# Patient Record
Sex: Male | Born: 1975 | Race: White | Hispanic: No | Marital: Married | Smoking: Current every day smoker
Health system: Southern US, Community
[De-identification: ages and names within clinical notes are randomized; demographics above are authoritative.]

## PROBLEM LIST (undated history)

## (undated) HISTORY — PX: OTHER SURGICAL HISTORY: SHX169

## (undated) HISTORY — PX: HERNIA REPAIR: SHX51

---

## 2003-09-02 ENCOUNTER — Encounter: Admission: RE | Admit: 2003-09-02 | Discharge: 2003-09-02 | Payer: Self-pay | Admitting: Specialist

## 2003-09-17 ENCOUNTER — Encounter: Admission: RE | Admit: 2003-09-17 | Discharge: 2003-09-17 | Payer: Self-pay | Admitting: Specialist

## 2003-10-02 ENCOUNTER — Encounter: Admission: RE | Admit: 2003-10-02 | Discharge: 2003-10-02 | Payer: Self-pay | Admitting: Specialist

## 2008-04-07 ENCOUNTER — Encounter: Admission: RE | Admit: 2008-04-07 | Discharge: 2008-04-07 | Payer: Self-pay | Admitting: Family Medicine

## 2009-03-23 ENCOUNTER — Emergency Department (HOSPITAL_COMMUNITY): Admission: EM | Admit: 2009-03-23 | Discharge: 2009-03-23 | Payer: Self-pay | Admitting: Family Medicine

## 2009-08-26 IMAGING — CR DG ANKLE COMPLETE 3+V*L*
3 series · 3 of 3 positions shown · non-contrast
Comparison: None

CLINICAL DATA: Injured several months ago with pain and swelling,
no recent trauma

LEFT ANKLE COMPLETE - 3+ VIEW

[t ankle joint ap left]
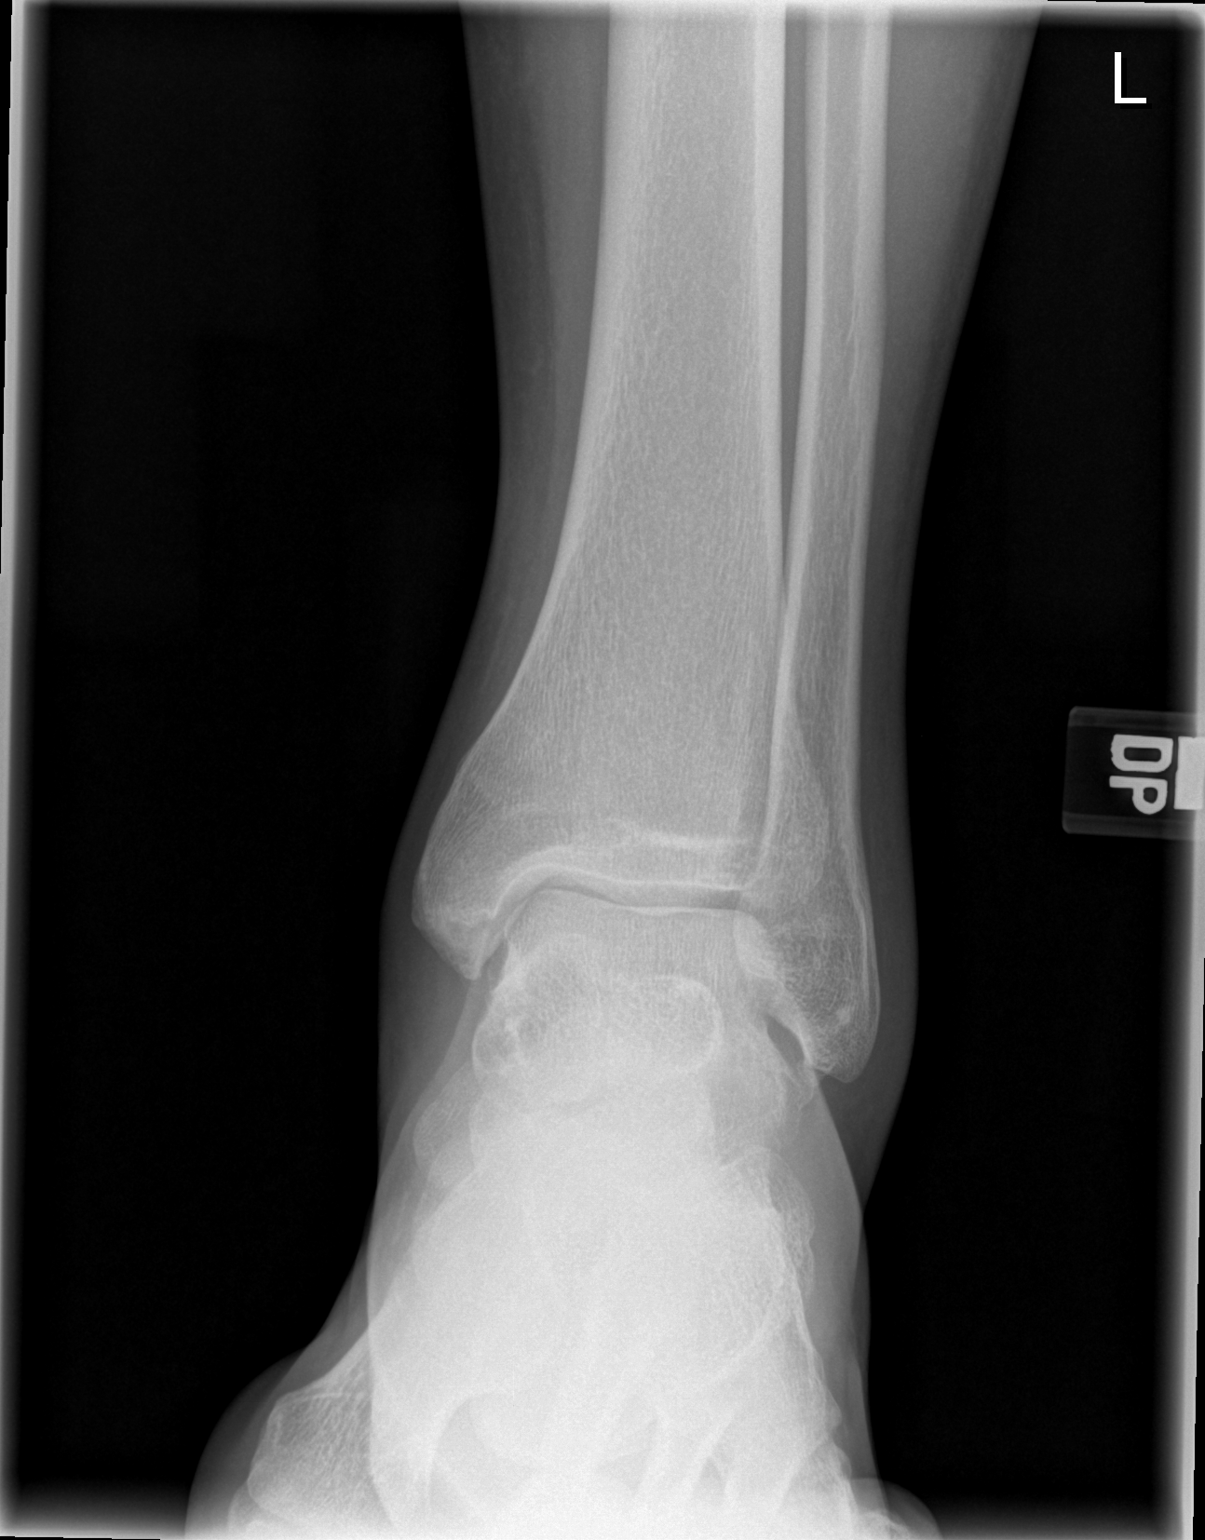

[t ankle joint oblique left]
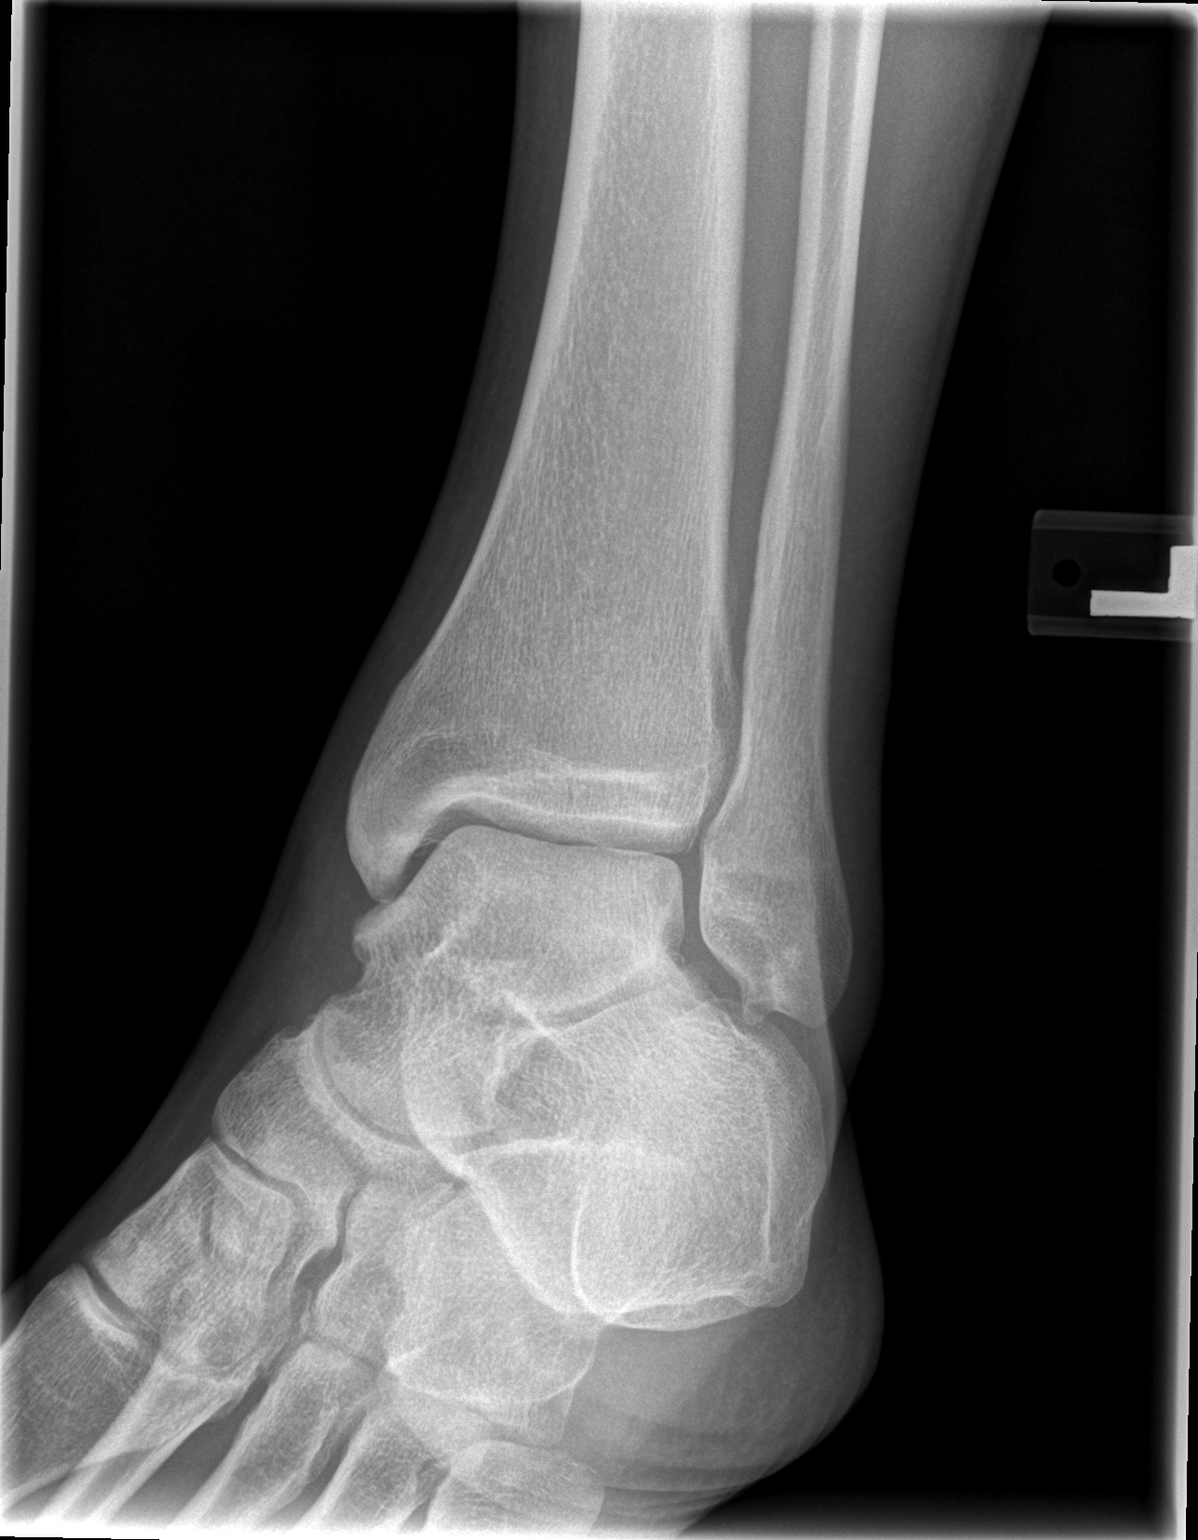

[t ankle joint lat left]
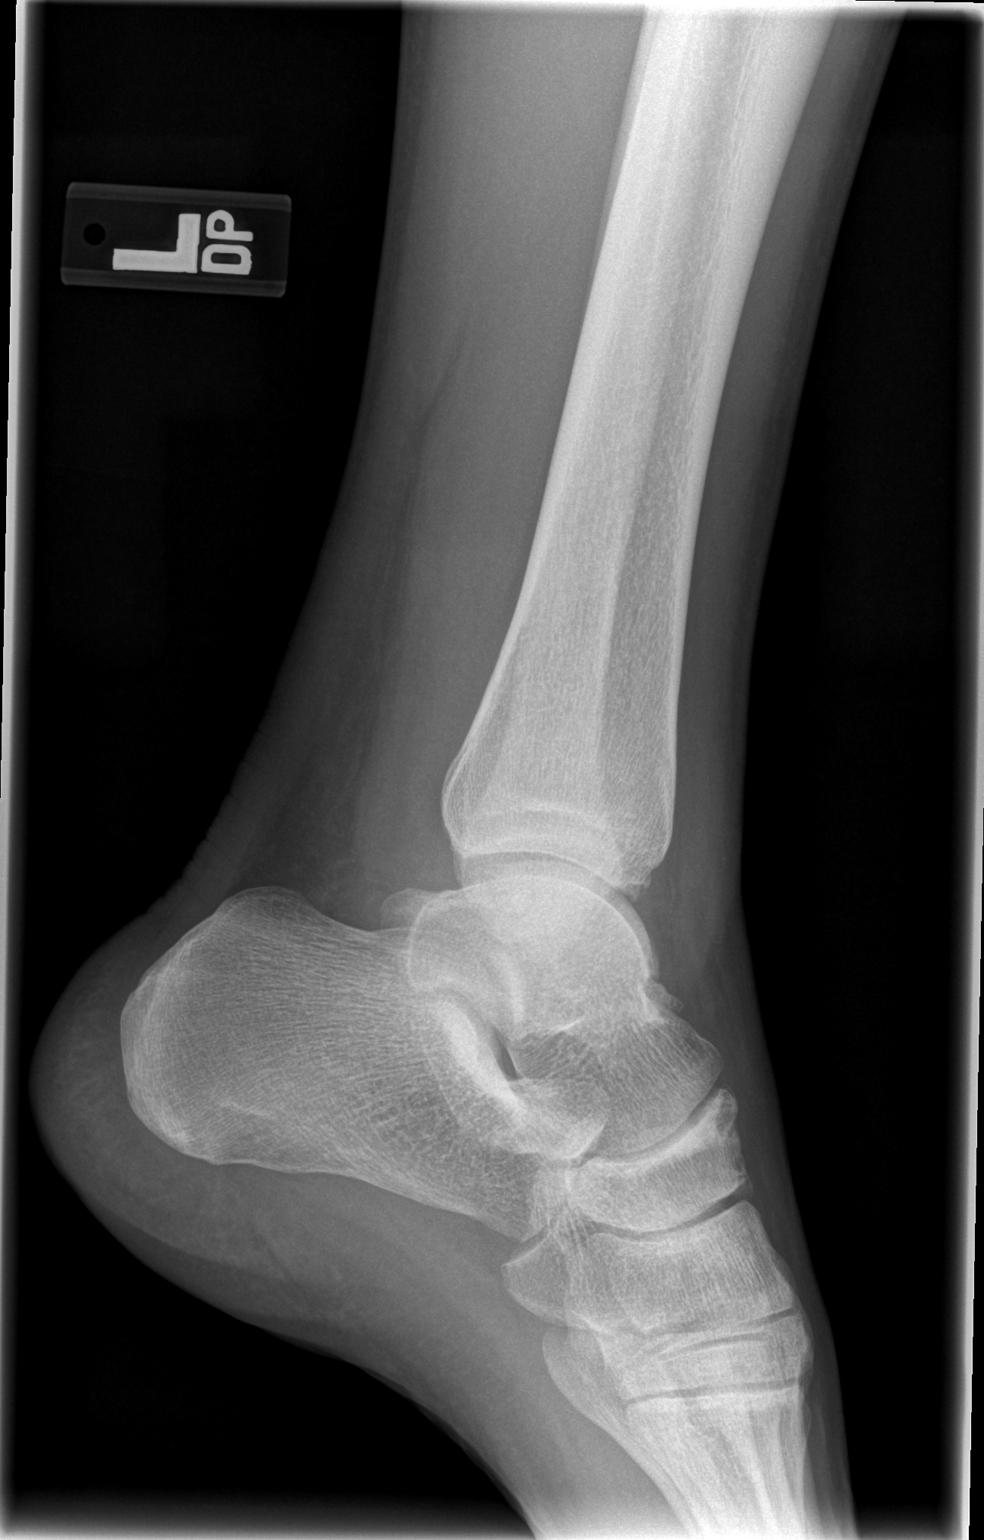

[3 of 3 positions shown; findings below may reference images not displayed]

FINDINGS: The ankle joint appears normal.  No fracture is seen.
Alignment is normal.
IMPRESSION: Negative

## 2010-08-14 ENCOUNTER — Encounter: Payer: Self-pay | Admitting: Specialist

## 2012-05-03 ENCOUNTER — Other Ambulatory Visit: Payer: Self-pay | Admitting: Physical Medicine and Rehabilitation

## 2012-05-03 DIAGNOSIS — M5136 Other intervertebral disc degeneration, lumbar region: Secondary | ICD-10-CM

## 2012-05-04 ENCOUNTER — Ambulatory Visit
Admission: RE | Admit: 2012-05-04 | Discharge: 2012-05-04 | Disposition: A | Discharge status: Home / Self Care | Payer: Managed Care, Other (non HMO) | Source: Ambulatory Visit | Attending: Physical Medicine and Rehabilitation | Admitting: Physical Medicine and Rehabilitation

## 2012-05-04 DIAGNOSIS — M5136 Other intervertebral disc degeneration, lumbar region: Secondary | ICD-10-CM

## 2012-05-04 MED ORDER — METHYLPREDNISOLONE ACETATE 40 MG/ML INJ SUSP (RADIOLOG
120.0000 mg | Freq: Once | INTRAMUSCULAR | Status: AC
  Administered 2012-05-04: 120 mg via EPIDURAL

## 2012-05-04 MED ORDER — IOHEXOL 180 MG/ML  SOLN
1.0000 mL | Freq: Once | INTRAMUSCULAR | Status: AC | PRN
  Administered 2012-05-04: 1 mL via EPIDURAL

## 2012-05-15 ENCOUNTER — Other Ambulatory Visit: Payer: Self-pay | Admitting: Physical Medicine and Rehabilitation

## 2012-05-15 DIAGNOSIS — M5136 Other intervertebral disc degeneration, lumbar region: Secondary | ICD-10-CM

## 2012-05-18 ENCOUNTER — Other Ambulatory Visit: Payer: Self-pay | Admitting: Physical Medicine and Rehabilitation

## 2012-05-18 ENCOUNTER — Ambulatory Visit
Admission: RE | Admit: 2012-05-18 | Discharge: 2012-05-18 | Disposition: A | Discharge status: Home / Self Care | Payer: Managed Care, Other (non HMO) | Source: Ambulatory Visit | Attending: Physical Medicine and Rehabilitation | Admitting: Physical Medicine and Rehabilitation

## 2012-05-18 DIAGNOSIS — M5136 Other intervertebral disc degeneration, lumbar region: Secondary | ICD-10-CM

## 2012-05-18 MED ORDER — METHYLPREDNISOLONE ACETATE 40 MG/ML INJ SUSP (RADIOLOG
120.0000 mg | Freq: Once | INTRAMUSCULAR | Status: AC
  Administered 2012-05-18: 120 mg via INTRA_ARTICULAR

## 2012-05-18 MED ORDER — IOHEXOL 180 MG/ML  SOLN
1.0000 mL | Freq: Once | INTRAMUSCULAR | Status: AC | PRN
  Administered 2012-05-18: 1 mL via INTRA_ARTICULAR

## 2012-06-20 ENCOUNTER — Other Ambulatory Visit: Payer: Self-pay | Admitting: Physical Medicine and Rehabilitation

## 2012-06-20 DIAGNOSIS — M5136 Other intervertebral disc degeneration, lumbar region: Secondary | ICD-10-CM

## 2012-06-26 ENCOUNTER — Other Ambulatory Visit: Payer: Self-pay | Admitting: Physical Medicine and Rehabilitation

## 2012-06-26 ENCOUNTER — Inpatient Hospital Stay
Admission: RE | Admit: 2012-06-26 | Discharge: 2012-06-26 | Disposition: A | Discharge status: Home / Self Care | Payer: Self-pay | Source: Ambulatory Visit | Attending: Physical Medicine and Rehabilitation | Admitting: Physical Medicine and Rehabilitation

## 2012-06-26 ENCOUNTER — Ambulatory Visit
Admission: RE | Admit: 2012-06-26 | Discharge: 2012-06-26 | Disposition: A | Discharge status: Home / Self Care | Payer: Managed Care, Other (non HMO) | Source: Ambulatory Visit | Attending: Physical Medicine and Rehabilitation | Admitting: Physical Medicine and Rehabilitation

## 2012-06-26 DIAGNOSIS — M549 Dorsalgia, unspecified: Secondary | ICD-10-CM

## 2012-06-26 DIAGNOSIS — M5136 Other intervertebral disc degeneration, lumbar region: Secondary | ICD-10-CM

## 2012-07-13 ENCOUNTER — Other Ambulatory Visit: Payer: Self-pay | Admitting: Specialist

## 2012-07-13 DIAGNOSIS — M5136 Other intervertebral disc degeneration, lumbar region: Secondary | ICD-10-CM

## 2012-07-13 DIAGNOSIS — M47819 Spondylosis without myelopathy or radiculopathy, site unspecified: Secondary | ICD-10-CM

## 2012-07-30 ENCOUNTER — Other Ambulatory Visit: Payer: Self-pay | Admitting: Orthopedic Surgery

## 2012-07-30 DIAGNOSIS — M545 Low back pain: Secondary | ICD-10-CM

## 2012-08-06 ENCOUNTER — Ambulatory Visit
Admission: RE | Admit: 2012-08-06 | Discharge: 2012-08-06 | Disposition: A | Discharge status: Home / Self Care | Payer: Managed Care, Other (non HMO) | Source: Ambulatory Visit | Attending: Orthopedic Surgery | Admitting: Orthopedic Surgery

## 2012-08-06 VITALS — BP 120/63 | HR 48

## 2012-08-06 DIAGNOSIS — M545 Low back pain: Secondary | ICD-10-CM

## 2012-08-06 MED ORDER — IOHEXOL 180 MG/ML  SOLN
1.0000 mL | Freq: Once | INTRAMUSCULAR | Status: AC | PRN
  Administered 2012-08-06: 1 mL via INTRA_ARTICULAR

## 2012-08-06 MED ORDER — METHYLPREDNISOLONE ACETATE 40 MG/ML INJ SUSP (RADIOLOG
60.0000 mg | Freq: Once | INTRAMUSCULAR | Status: AC
  Administered 2012-08-06: 60 mg via INTRA_ARTICULAR

## 2012-08-08 NOTE — Progress Notes (Signed)
Patient called to report no relief from Facet and SI joint injections two days ago, 08/06/2012.  He states an understanding that he cannot repeat these injections for at least eight weeks.  Encouraged patient to touch base with Dr. Ethelene Hal who sent him here.  He asked for other surgeons to visit for another opinion.  Suggested [facility]; his wife (a Engineer, civil (consulting)) is familiar with Dr. Venetia Maxon.  jkl

## 2015-04-24 ENCOUNTER — Other Ambulatory Visit: Payer: Self-pay | Admitting: Orthopedic Surgery

## 2015-05-01 ENCOUNTER — Encounter (HOSPITAL_BASED_OUTPATIENT_CLINIC_OR_DEPARTMENT_OTHER): Payer: Self-pay | Admitting: *Deleted

## 2015-05-04 ENCOUNTER — Ambulatory Visit (HOSPITAL_BASED_OUTPATIENT_CLINIC_OR_DEPARTMENT_OTHER)
Admission: RE | Admit: 2015-05-04 | Discharge: 2015-05-04 | Disposition: A | Discharge status: Home / Self Care | Payer: Managed Care, Other (non HMO) | Source: Ambulatory Visit | Attending: Orthopedic Surgery | Admitting: Orthopedic Surgery

## 2015-05-04 ENCOUNTER — Encounter (HOSPITAL_BASED_OUTPATIENT_CLINIC_OR_DEPARTMENT_OTHER): Payer: Self-pay | Admitting: *Deleted

## 2015-05-04 ENCOUNTER — Ambulatory Visit (HOSPITAL_BASED_OUTPATIENT_CLINIC_OR_DEPARTMENT_OTHER): Payer: Managed Care, Other (non HMO) | Admitting: Anesthesiology

## 2015-05-04 ENCOUNTER — Encounter (HOSPITAL_BASED_OUTPATIENT_CLINIC_OR_DEPARTMENT_OTHER)
Admission: RE | Disposition: A | Discharge status: Home / Self Care | Payer: Self-pay | Source: Ambulatory Visit | Attending: Orthopedic Surgery

## 2015-05-04 DIAGNOSIS — M75112 Incomplete rotator cuff tear or rupture of left shoulder, not specified as traumatic: Secondary | ICD-10-CM | POA: Diagnosis not present

## 2015-05-04 DIAGNOSIS — X58XXXA Exposure to other specified factors, initial encounter: Secondary | ICD-10-CM | POA: Insufficient documentation

## 2015-05-04 DIAGNOSIS — S43432A Superior glenoid labrum lesion of left shoulder, initial encounter: Secondary | ICD-10-CM | POA: Insufficient documentation

## 2015-05-04 DIAGNOSIS — Z01818 Encounter for other preprocedural examination: Secondary | ICD-10-CM | POA: Diagnosis not present

## 2015-05-04 DIAGNOSIS — F17210 Nicotine dependence, cigarettes, uncomplicated: Secondary | ICD-10-CM | POA: Diagnosis not present

## 2015-05-04 SURGERY — SHOULDER ARTHROSCOPY WITH SUBACROMIAL DECOMPRESSION AND DISTAL CLAVICLE EXCISION
Anesthesia: Regional | Site: Shoulder | Laterality: Left

## 2015-05-04 MED ORDER — SUCCINYLCHOLINE CHLORIDE 20 MG/ML IJ SOLN
INTRAMUSCULAR | Status: DC | PRN
  Administered 2015-05-04: 100 mg via INTRAVENOUS

## 2015-05-04 MED ORDER — GLYCOPYRROLATE 0.2 MG/ML IJ SOLN
0.2000 mg | Freq: Once | INTRAMUSCULAR | Status: AC | PRN
  Administered 2015-05-04: 0.2 mg via INTRAVENOUS

## 2015-05-04 MED ORDER — ONDANSETRON HCL 4 MG/2ML IJ SOLN
INTRAMUSCULAR | Status: DC | PRN
  Administered 2015-05-04: 4 mg via INTRAVENOUS

## 2015-05-04 MED ORDER — PROMETHAZINE HCL 25 MG/ML IJ SOLN
6.2500 mg | INTRAMUSCULAR | Status: DC | PRN
Start: 2015-05-04 — End: 2015-05-04

## 2015-05-04 MED ORDER — BUPIVACAINE-EPINEPHRINE (PF) 0.5% -1:200000 IJ SOLN
INTRAMUSCULAR | Status: DC | PRN
  Administered 2015-05-04: 30 mL

## 2015-05-04 MED ORDER — FENTANYL CITRATE (PF) 100 MCG/2ML IJ SOLN
INTRAMUSCULAR | Status: AC
  Filled 2015-05-04: qty 2

## 2015-05-04 MED ORDER — LACTATED RINGERS IV SOLN
INTRAVENOUS | Status: DC
  Administered 2015-05-04 (×2): via INTRAVENOUS

## 2015-05-04 MED ORDER — DEXAMETHASONE SODIUM PHOSPHATE 4 MG/ML IJ SOLN
INTRAMUSCULAR | Status: DC | PRN
  Administered 2015-05-04: 10 mg via INTRAVENOUS

## 2015-05-04 MED ORDER — SCOPOLAMINE 1 MG/3DAYS TD PT72: 1.0000 | MEDICATED_PATCH | Freq: Once | TRANSDERMAL | Status: DC | PRN

## 2015-05-04 MED ORDER — PHENYLEPHRINE HCL 10 MG/ML IJ SOLN
INTRAMUSCULAR | Status: DC | PRN
  Administered 2015-05-04 (×2): 40 ug via INTRAVENOUS

## 2015-05-04 MED ORDER — DOCUSATE SODIUM 100 MG PO CAPS: 100.0000 mg | ORAL_CAPSULE | Freq: Three times a day (TID) | ORAL | Status: AC | PRN

## 2015-05-04 MED ORDER — FENTANYL CITRATE (PF) 100 MCG/2ML IJ SOLN
50.0000 ug | INTRAMUSCULAR | Status: DC | PRN
  Administered 2015-05-04: 100 ug via INTRAVENOUS
  Administered 2015-05-04: 50 ug via INTRAVENOUS

## 2015-05-04 MED ORDER — CEFAZOLIN SODIUM-DEXTROSE 2-3 GM-% IV SOLR
INTRAVENOUS | Status: AC
  Filled 2015-05-04: qty 50

## 2015-05-04 MED ORDER — DEXAMETHASONE SODIUM PHOSPHATE 10 MG/ML IJ SOLN
INTRAMUSCULAR | Status: AC
  Filled 2015-05-04: qty 1

## 2015-05-04 MED ORDER — CEFAZOLIN SODIUM-DEXTROSE 2-3 GM-% IV SOLR
2.0000 g | INTRAVENOUS | Status: AC
  Administered 2015-05-04: 2 g via INTRAVENOUS

## 2015-05-04 MED ORDER — MEPERIDINE HCL 25 MG/ML IJ SOLN: 6.2500 mg | INTRAMUSCULAR | Status: DC | PRN

## 2015-05-04 MED ORDER — MIDAZOLAM HCL 2 MG/2ML IJ SOLN
INTRAMUSCULAR | Status: AC
  Filled 2015-05-04: qty 2

## 2015-05-04 MED ORDER — LIDOCAINE HCL (CARDIAC) 20 MG/ML IV SOLN
INTRAVENOUS | Status: DC | PRN
  Administered 2015-05-04: 50 mg via INTRAVENOUS

## 2015-05-04 MED ORDER — HYDROMORPHONE HCL 1 MG/ML IJ SOLN: 0.2500 mg | INTRAMUSCULAR | Status: DC | PRN

## 2015-05-04 MED ORDER — LIDOCAINE HCL (CARDIAC) 20 MG/ML IV SOLN
INTRAVENOUS | Status: AC
  Filled 2015-05-04: qty 5

## 2015-05-04 MED ORDER — POVIDONE-IODINE 7.5 % EX SOLN: Freq: Once | CUTANEOUS | Status: DC

## 2015-05-04 MED ORDER — MIDAZOLAM HCL 2 MG/2ML IJ SOLN
1.0000 mg | INTRAMUSCULAR | Status: DC | PRN
  Administered 2015-05-04: 2 mg via INTRAVENOUS

## 2015-05-04 MED ORDER — PROPOFOL 10 MG/ML IV BOLUS
INTRAVENOUS | Status: DC | PRN
  Administered 2015-05-04: 300 mg via INTRAVENOUS

## 2015-05-04 MED ORDER — ONDANSETRON HCL 4 MG/2ML IJ SOLN
INTRAMUSCULAR | Status: AC
  Filled 2015-05-04: qty 2

## 2015-05-04 MED ORDER — OXYCODONE-ACETAMINOPHEN 5-325 MG PO TABS: 1.0000 | ORAL_TABLET | ORAL | Status: DC | PRN

## 2015-05-04 MED ORDER — SODIUM CHLORIDE 0.9 % IR SOLN
Status: DC | PRN
  Administered 2015-05-04: 5000 mL

## 2015-05-04 MED ORDER — FENTANYL CITRATE (PF) 100 MCG/2ML IJ SOLN
INTRAMUSCULAR | Status: AC
  Filled 2015-05-04: qty 4

## 2015-05-04 SURGICAL SUPPLY — 82 items
BENZOIN TINCTURE PRP APPL 2/3 (GAUZE/BANDAGES/DRESSINGS) IMPLANT
BLADE CLIPPER SURG (BLADE) IMPLANT
BLADE SURG 15 STRL LF DISP TIS (BLADE) IMPLANT
BLADE SURG 15 STRL SS (BLADE)
BUR OVAL 4.0 (BURR) ×4 IMPLANT
CANNULA 5.75X71 LONG (CANNULA) ×4 IMPLANT
CANNULA TWIST IN 8.25X7CM (CANNULA) IMPLANT
CHLORAPREP W/TINT 26ML (MISCELLANEOUS) ×4 IMPLANT
CLOSURE WOUND 1/2 X4 (GAUZE/BANDAGES/DRESSINGS)
DECANTER SPIKE VIAL GLASS SM (MISCELLANEOUS) IMPLANT
DRAPE INCISE IOBAN 66X45 STRL (DRAPES) ×4 IMPLANT
DRAPE STERI 35X30 U-POUCH (DRAPES) ×4 IMPLANT
DRAPE SURG 17X23 STRL (DRAPES) ×4 IMPLANT
DRAPE U 20/CS (DRAPES) ×4 IMPLANT
DRAPE U-SHAPE 47X51 STRL (DRAPES) ×4 IMPLANT
DRAPE U-SHAPE 76X120 STRL (DRAPES) ×8 IMPLANT
DRSG PAD ABDOMINAL 8X10 ST (GAUZE/BANDAGES/DRESSINGS) ×4 IMPLANT
ELECT REM PT RETURN 9FT ADLT (ELECTROSURGICAL) ×4
ELECTRODE REM PT RTRN 9FT ADLT (ELECTROSURGICAL) ×2 IMPLANT
GAUZE SPONGE 4X4 12PLY STRL (GAUZE/BANDAGES/DRESSINGS) ×4 IMPLANT
GAUZE SPONGE 4X4 16PLY XRAY LF (GAUZE/BANDAGES/DRESSINGS) IMPLANT
GAUZE XEROFORM 1X8 LF (GAUZE/BANDAGES/DRESSINGS) ×4 IMPLANT
GLOVE BIO SURGEON STRL SZ 6.5 (GLOVE) ×3 IMPLANT
GLOVE BIO SURGEON STRL SZ7 (GLOVE) ×4 IMPLANT
GLOVE BIO SURGEON STRL SZ7.5 (GLOVE) ×4 IMPLANT
GLOVE BIO SURGEONS STRL SZ 6.5 (GLOVE) ×1
GLOVE BIOGEL M STRL SZ7.5 (GLOVE) ×4 IMPLANT
GLOVE BIOGEL PI IND STRL 7.0 (GLOVE) ×2 IMPLANT
GLOVE BIOGEL PI IND STRL 8 (GLOVE) ×4 IMPLANT
GLOVE BIOGEL PI INDICATOR 7.0 (GLOVE) ×2
GLOVE BIOGEL PI INDICATOR 8 (GLOVE) ×4
GOWN STRL REUS W/ TWL LRG LVL3 (GOWN DISPOSABLE) ×2 IMPLANT
GOWN STRL REUS W/ TWL XL LVL3 (GOWN DISPOSABLE) ×4 IMPLANT
GOWN STRL REUS W/TWL LRG LVL3 (GOWN DISPOSABLE) ×2
GOWN STRL REUS W/TWL XL LVL3 (GOWN DISPOSABLE) ×4
LASSO CRESCENT QUICKPASS (SUTURE) IMPLANT
LIQUID BAND (GAUZE/BANDAGES/DRESSINGS) IMPLANT
MANIFOLD NEPTUNE II (INSTRUMENTS) ×4 IMPLANT
NDL SUT 6 .5 CRC .975X.05 MAYO (NEEDLE) IMPLANT
NEEDLE 1/2 CIR CATGUT .05X1.09 (NEEDLE) IMPLANT
NEEDLE MAYO TAPER (NEEDLE)
NEEDLE SCORPION MULTI FIRE (NEEDLE) IMPLANT
NS IRRIG 1000ML POUR BTL (IV SOLUTION) IMPLANT
PACK ARTHROSCOPY DSU (CUSTOM PROCEDURE TRAY) ×4 IMPLANT
PACK BASIN DAY SURGERY FS (CUSTOM PROCEDURE TRAY) ×4 IMPLANT
PENCIL BUTTON HOLSTER BLD 10FT (ELECTRODE) IMPLANT
RESECTOR FULL RADIUS 4.2MM (BLADE) ×4 IMPLANT
SHEET MEDIUM DRAPE 40X70 STRL (DRAPES) IMPLANT
SLEEVE SCD COMPRESS KNEE MED (MISCELLANEOUS) ×4 IMPLANT
SLING ARM IMMOBILIZER MED (SOFTGOODS) IMPLANT
SLING ARM LRG ADULT FOAM STRAP (SOFTGOODS) IMPLANT
SLING ARM MED ADULT FOAM STRAP (SOFTGOODS) IMPLANT
SLING ARM XL FOAM STRAP (SOFTGOODS) IMPLANT
SPONGE LAP 4X18 X RAY DECT (DISPOSABLE) IMPLANT
STRIP CLOSURE SKIN 1/2X4 (GAUZE/BANDAGES/DRESSINGS) IMPLANT
SUCTION FRAZIER TIP 10 FR DISP (SUCTIONS) IMPLANT
SUPPORT WRAP ARM LG (MISCELLANEOUS) ×4 IMPLANT
SUT BONE WAX W31G (SUTURE) IMPLANT
SUT ETHIBOND 2 OS 4 DA (SUTURE) IMPLANT
SUT ETHILON 3 0 PS 1 (SUTURE) ×4 IMPLANT
SUT ETHILON 4 0 PS 2 18 (SUTURE) IMPLANT
SUT FIBERWIRE #2 38 T-5 BLUE (SUTURE)
SUT MNCRL AB 3-0 PS2 18 (SUTURE) IMPLANT
SUT MNCRL AB 4-0 PS2 18 (SUTURE) IMPLANT
SUT PDS AB 0 CT 36 (SUTURE) IMPLANT
SUT PROLENE 3 0 PS 2 (SUTURE) IMPLANT
SUT TIGER TAPE 7 IN WHITE (SUTURE) IMPLANT
SUT VIC AB 0 CT1 27 (SUTURE)
SUT VIC AB 0 CT1 27XBRD ANBCTR (SUTURE) IMPLANT
SUT VIC AB 2-0 SH 27 (SUTURE)
SUT VIC AB 2-0 SH 27XBRD (SUTURE) IMPLANT
SUTURE FIBERWR #2 38 T-5 BLUE (SUTURE) IMPLANT
SYR BULB 3OZ (MISCELLANEOUS) IMPLANT
TAPE FIBER 2MM 7IN #2 BLUE (SUTURE) IMPLANT
TOWEL OR 17X24 6PK STRL BLUE (TOWEL DISPOSABLE) ×4 IMPLANT
TOWEL OR NON WOVEN STRL DISP B (DISPOSABLE) ×4 IMPLANT
TUBE CONNECTING 20'X1/4 (TUBING) ×1
TUBE CONNECTING 20X1/4 (TUBING) ×3 IMPLANT
TUBING ARTHROSCOPY IRRIG 16FT (MISCELLANEOUS) ×4 IMPLANT
WAND STAR VAC 90 (SURGICAL WAND) ×4 IMPLANT
WATER STERILE IRR 1000ML POUR (IV SOLUTION) ×4 IMPLANT
YANKAUER SUCT BULB TIP NO VENT (SUCTIONS) IMPLANT

## 2015-05-04 NOTE — H&P (Signed)
Clifford Peterson. is an 39 y.o. male.   Chief Complaint: L shoulder pain HPI: AC joint pain with temporary response to injection.  Pain limits sleep and daily activity.  Also with impingement symptoms.  History reviewed. No pertinent past medical history.  Past Surgical History  Procedure Laterality Date  . Right knee arthroscopy      X 2  . Left knee arthroscopy      x 2  . Right elbow surgery    . Hernia repair      X 2 ( one 15 years ago)    History reviewed. No pertinent family history. Social History:  reports that he has been smoking Cigarettes.  He has been smoking about 0.50 packs per day. He does not have any smokeless tobacco history on file. He reports that he drinks alcohol. He reports that he does not use illicit drugs.  Allergies: No Known Allergies  No prescriptions prior to admission    No results found for this or any previous visit (from the past 48 hour(s)). No results found.  Review of Systems  All other systems reviewed and are negative.   Blood pressure 128/63, pulse 67, temperature 97.7 F (36.5 C), temperature source Oral, resp. rate 15, height 6' 2"  (1.88 m), weight 80.287 kg (177 lb), SpO2 100 %. Physical Exam  Constitutional: He is oriented to person, place, and time. He appears well-developed and well-nourished.  HENT:  Head: Atraumatic.  Eyes: EOM are normal.  Cardiovascular: Intact distal pulses.   Respiratory: Effort normal.  Musculoskeletal:  L shoulder with TTP at [facility] joint  Neurological: He is alert and oriented to person, place, and time.  Skin: Skin is warm and dry.  Psychiatric: He has a normal mood and affect.     Assessment/Plan L AC pain with impingement, failed conservative treatment. Plan L arth SAD/DCR Risks / benefits of surgery discussed Consent on chart  NPO for OR Preop antibiotics   Clifford Peterson 05/04/2015, 11:53 AM

## 2015-05-04 NOTE — Anesthesia Postprocedure Evaluation (Signed)
Anesthesia Post Note  Patient: Clifford Peterson.  Procedure(s) Performed: Procedure(s) (LRB): SHOULDER ARTHROSCOPY WITH SUBACROMIAL DECOMPRESSION, DISTAL CLAVICAL RESECTION (Left)  Anesthesia type: General + ISB  Patient location: PACU  Post pain: Pain level controlled  Post assessment: Post-op Vital signs reviewed  Last Vitals: BP 134/96 mmHg  Pulse 68  Temp(Src) 36.5 C (Oral)  Resp 16  Ht 6' 2"  (1.88 m)  Wt 177 lb (80.287 kg)  BMI 22.72 kg/m2  SpO2 97%  Post vital signs: Reviewed  Level of consciousness: sedated  Complications: No apparent anesthesia complications

## 2015-05-04 NOTE — Discharge Instructions (Signed)
Discharge Instructions after Arthroscopic Shoulder Surgery   A sling has been provided for you. You may remove the sling after 72 hours. The sling may be worn for your protection, if you are in a crowd.  Use ice on the shoulder intermittently over the first 48 hours after surgery.  Pain medication has been prescribed for you.  Use your medication liberally over the first 48 hours, and then begin to taper your use. You may take Extra Strength Tylenol or Tylenol only in place of the pain pills. DO NOT take ANY nonsteroidal anti-inflammatory pain medications: Advil, Motrin, Ibuprofen, Aleve, Naproxen, or Naprosyn.  You may remove your dressing after two days.  You may shower 5 days after surgery. The incision CANNOT get wet prior to 5 days. Simply allow the water to wash over the site and then pat dry. Do not rub the incision. Make sure your axilla (armpit) is completely dry after showering.  Take one aspirin a day for 2 weeks after surgery, unless you have an aspirin sensitivity/allergy or asthma.  Three to 5 times each day you should perform assisted overhead reaching and external rotation (outward turning) exercises with the operative arm. Both exercises should be done with the non-operative arm used as the "therapist arm" while the operative arm remains relaxed. Ten of each exercise should be done three to five times each day.    Overhead reach is helping to lift your stiff arm up as high as it will go. To stretch your overhead reach, lie flat on your back, relax, and grasp the wrist of the tight shoulder with your opposite hand. Using the power in your opposite arm, bring the stiff arm up as far as it is comfortable. Start holding it for ten seconds and then work up to where you can hold it for a count of 30. Breathe slowly and deeply while the arm is moved. Repeat this stretch ten times, trying to help the arm up a little higher each time.       External rotation is turning the arm out to  the side while your elbow stays close to your body. External rotation is best stretched while you are lying on your back. Hold a cane, yardstick, broom handle, or dowel in both hands. Bend both elbows to a right angle. Use steady, gentle force from your normal arm to rotate the hand of the stiff shoulder out away from your body. Continue the rotation as far as it will go comfortably, holding it there for a count of 10. Repeat this exercise ten times.     Please call [phone] during normal business hours or [phone] after hours for any problems. Including the following:  - excessive redness of the incisions - drainage for more than 4 days - fever of more than 101.5 F  *Please note that pain medications will not be refilled after hours or on weekends.   Regional Anesthesia Blocks  1. Numbness or the inability to move the "blocked" extremity may last from 3-48 hours after placement. The length of time depends on the medication injected and your individual response to the medication. If the numbness is not going away after 48 hours, call your surgeon.  2. The extremity that is blocked will need to be protected until the numbness is gone and the  Strength has returned. Because you cannot feel it, you will need to take extra care to avoid injury. Because it may be weak, you may have difficulty moving it or using  it. You may not know what position it is in without looking at it while the block is in effect.  3. For blocks in the legs and feet, returning to weight bearing and walking needs to be done carefully. You will need to wait until the numbness is entirely gone and the strength has returned. You should be able to move your leg and foot normally before you try and bear weight or walk. You will need someone to be with you when you first try to ensure you do not fall and possibly risk injury.  4. Bruising and tenderness at the needle site are common side effects and will resolve in a few  days.  5. Persistent numbness or new problems with movement should be communicated to the surgeon or the [place] [phone] [place] [phone]).   Post Anesthesia Home Care Instructions  Activity: Get plenty of rest for the remainder of the day. A responsible adult should stay with you for 24 hours following the procedure.  For the next 24 hours, DO NOT: -Drive a car -Paediatric nurse -Drink alcoholic beverages -Take any medication unless instructed by your physician -Make any legal decisions or sign important papers.  Meals: Start with liquid foods such as gelatin or soup. Progress to regular foods as tolerated. Avoid greasy, spicy, heavy foods. If nausea and/or vomiting occur, drink only clear liquids until the nausea and/or vomiting subsides. Call your physician if vomiting continues.  Special Instructions/Symptoms: Your throat may feel dry or sore from the anesthesia or the breathing tube placed in your throat during surgery. If this causes discomfort, gargle with warm salt water. The discomfort should disappear within 24 hours.  If you had a scopolamine patch placed behind your ear for the management of post- operative nausea and/or vomiting:  1. The medication in the patch is effective for 72 hours, after which it should be removed.  Wrap patch in a tissue and discard in the trash. Wash hands thoroughly with soap and water. 2. You may remove the patch earlier than 72 hours if you experience unpleasant side effects which may include dry mouth, dizziness or visual disturbances. 3. Avoid touching the patch. Wash your hands with soap and water after contact with the patch.

## 2015-05-04 NOTE — Progress Notes (Signed)
Assisted Dr. Lissa Hoard with left, ultrasound guided, interscalene  block. Side rails up, monitors on throughout procedure. See vital signs in flow sheet. Tolerated Procedure well.

## 2015-05-04 NOTE — Anesthesia Procedure Notes (Addendum)
Anesthesia Regional Block:  Interscalene brachial plexus block  Pre-Anesthetic Checklist: ,, timeout performed, Correct Patient, Correct Site, Correct Laterality, Correct Procedure, Correct Position, site marked, Risks and benefits discussed, Surgical consent,  Pre-op evaluation,  Post-op pain management  Laterality: Left  Prep: chloraprep       Needles:  Injection technique: Single-shot  Needle Type: Stimulator Needle - 40     Needle Length: 4cm 4 cm Needle Gauge: 22 and 22 G    Additional Needles:  Procedures: nerve stimulator Interscalene brachial plexus block Narrative:  Injection made incrementally with aspirations every 5 mL.  Performed by: Personally  Anesthesiologist: Nolon Nations  Additional Notes: BP cuff, EKG monitors applied. Sedation begun. Nerve location verified with U/S. Anesthetic injected incrementally, slowly , and after neg aspirations under direct u/s guidance. Good perineural spread. Tolerated well.   Procedure Name: Intubation Date/Time: 05/04/2015 12:06 PM Performed by: Lieutenant Diego Pre-anesthesia Checklist: Patient identified, Emergency Drugs available, Suction available and Patient being monitored Patient Re-evaluated:Patient Re-evaluated prior to inductionOxygen Delivery Method: Circle System Utilized Preoxygenation: Pre-oxygenation with 100% oxygen Intubation Type: IV induction Ventilation: Mask ventilation without difficulty Laryngoscope Size: Miller and 2 Grade View: Grade I Tube type: Oral Tube size: 7.0 mm Number of attempts: 1 Airway Equipment and Method: Stylet and Oral airway Placement Confirmation: ETT inserted through vocal cords under direct vision,  positive ETCO2 and breath sounds checked- equal and bilateral Secured at: 23 cm Tube secured with: Tape Dental Injury: Teeth and Oropharynx as per pre-operative assessment

## 2015-05-04 NOTE — Anesthesia Preprocedure Evaluation (Signed)
Anesthesia Evaluation  Patient identified by MRN, date of birth, ID band Patient awake    Reviewed: Allergy & Precautions, NPO status , Patient's Chart, lab work & pertinent test results  Airway Mallampati: II  TM Distance: >3 FB Neck ROM: Full    Dental no notable dental hx.    Pulmonary neg pulmonary ROS, Current Smoker,    Pulmonary exam normal breath sounds clear to auscultation       Cardiovascular negative cardio ROS Normal cardiovascular exam Rhythm:Regular Rate:Normal     Neuro/Psych negative neurological ROS  negative psych ROS   GI/Hepatic negative GI ROS, Neg liver ROS,   Endo/Other  negative endocrine ROS  Renal/GU negative Renal ROS     Musculoskeletal negative musculoskeletal ROS (+)   Abdominal   Peds  Hematology negative hematology ROS (+)   Anesthesia Other Findings   Reproductive/Obstetrics negative OB ROS                             Anesthesia Physical Anesthesia Plan  ASA: II  Anesthesia Plan: General and Regional   Post-op Pain Management:    Induction: Intravenous  Airway Management Planned: Oral ETT  Additional Equipment:   Intra-op Plan:   Post-operative Plan: Extubation in OR  Informed Consent: I have reviewed the patients History and Physical, chart, labs and discussed the procedure including the risks, benefits and alternatives for the proposed anesthesia with the patient or authorized representative who has indicated his/her understanding and acceptance.   Dental advisory given  Plan Discussed with: CRNA  Anesthesia Plan Comments:         Anesthesia Quick Evaluation

## 2015-05-04 NOTE — Transfer of Care (Signed)
Immediate Anesthesia Transfer of Care Note  Patient: Clifford Peterson.  Procedure(s) Performed: Procedure(s) with comments: SHOULDER ARTHROSCOPY WITH SUBACROMIAL DECOMPRESSION, DISTAL CLAVICAL RESECTION (Left) - Left shoulder arthroscopy subacromial decompression, distal clavical resection  Patient Location: PACU  Anesthesia Type:GA combined with regional for post-op pain  Level of Consciousness: awake, sedated and patient cooperative  Airway & Oxygen Therapy: Patient Spontanous Breathing and Patient connected to face mask oxygen  Post-op Assessment: Report given to RN and Post -op Vital signs reviewed and stable  Post vital signs: Reviewed and stable  Last Vitals:  Filed Vitals:   05/04/15 1140  BP:   Pulse: 67  Temp:   Resp: 15    Complications: No apparent anesthesia complications

## 2015-05-04 NOTE — Op Note (Signed)
Procedure(s):   Clifford Peterson. male 39 y.o. 05/04/2015  Procedure(s) and Anesthesia Type:    #1 left shoulder arthroscopic debridement partial-thickness rotator cuff tear type I SLAP tear #2 left shoulder arthroscopic subacromial decompression/bursectomy #3 left shoulder arthroscopic distal clavicle excision  Surgeon(s) and Role:    * Tania Ade, MD - Primary     Surgeon: Nita Sells   Assistants: Jeanmarie Hubert PA-C (Danielle was present and scrubbed throughout the procedure and was essential in positioning, assisting with the camera and instrumentation,, and closure)  Anesthesia: General endotracheal anesthesia with preoperative interscalene block given by the attending anesthesiologist     Procedure Detail    Estimated Blood Loss: Min         Drains: none  Blood Given: none         Specimens: none        Complications:  * No complications entered in OR log *         Disposition: PACU - hemodynamically stable.         Condition: stable    Procedure:   INDICATIONS FOR SURGERY: The patient is 39 y.o. male who has had a 5 month history of left shoulder pain which has been refractory to nonoperative management including activity modification injection therapy and medications. He had one acromioclavicular joint injection which completely alleviated most of his pain for about a week but then the pain returned. He also had findings of impingement on exam.  OPERATIVE FINDINGS: Examination under anesthesia: No stiffness or instability.  DESCRIPTION OF PROCEDURE: The patient was identified in preoperative  holding area where I personally marked the operative site after  verifying site, side, and procedure with the patient. An interscalene block was given by the attending anesthesiologist the holding area.  The patient was taken back to the operating room where general anesthesia was induced without complication and was placed in the  beach-chair position with the back  elevated about 60 degrees and all extremities and head and neck carefully padded and  positioned.   The left upper extremity was then prepped and  draped in a standard sterile fashion. The appropriate time-out  procedure was carried out. The patient did receive IV antibiotics  within 30 minutes of incision.   A small posterior portal incision was made and the arthroscope was introduced into the joint. An anterior portal was then established above the subscapularis using needle localization. Small cannula was placed anteriorly. Diagnostic arthroscopy was then carried out .  He was noted to have significant fraying of the anterior superior labrum. This was extensively debrided with shaver. The biceps anchor was felt to be intact. The biceps tendon was pulled in the joint and was intact. There is some mild articular sided fraying of the subscapularis which was debrided. The supraspinatus had fairly significant underside of partial-thickness tearing involving about 20% of the articular insertion on the tuberosity. This was extensively debrided with a shaver down to bleeding tendon to promote healing. There is no evidence for full-thickness penetration. The humeral joint surfaces were intact.  The arthroscope was then introduced into the subacromial space a standard lateral portal was established with needle localization. The shaver was used through the lateral portal to perform extensive bursectomy. Coracoacromial ligament was examined and found to be frayed indicating impingement.  The bursal surface the rotator cuff was completely intact.  The coracoacromial ligament was taken down off the anterior acromion with the ArthroCare exposing a small hooked anterior acromial spur. A high-speed  bur was then used through the lateral portal to take down the anterior acromial spur from lateral to medial in a standard acromioplasty.  The acromioplasty was also viewed from the  lateral portal and the bur was used as necessary to ensure that the acromion was completely flat from posterior to anterior.  The distal clavicle was exposed arthroscopically and the bur was used to take off the undersurface for approximately 8 mm from the lateral portal. The bur was then moved to an anterior portal position to complete the distal clavicle excision resecting about 8 mm of the distal clavicle and a smooth even fashion. This was viewed from anterior and lateral portals and felt to be complete.  The arthroscopic equipment was removed from the joint and the portals were closed with 3-0 nylon in an interrupted fashion. Sterile dressings were then applied including Xeroform 4 x 4's ABDs and tape. The patient was then allowed to awaken from general anesthesia, placed in a sling, transferred to the stretcher and taken to the recovery room in stable condition.   POSTOPERATIVE PLAN: The patient will be discharged home today and will followup in one week for suture removal and wound check.  He can get into therapy right away.

## 2015-05-05 ENCOUNTER — Encounter (HOSPITAL_BASED_OUTPATIENT_CLINIC_OR_DEPARTMENT_OTHER): Payer: Self-pay | Admitting: Orthopedic Surgery

## 2015-05-26 ENCOUNTER — Encounter (HOSPITAL_BASED_OUTPATIENT_CLINIC_OR_DEPARTMENT_OTHER): Payer: Self-pay | Admitting: Orthopedic Surgery

## 2019-08-05 ENCOUNTER — Other Ambulatory Visit: Payer: Self-pay

## 2019-08-05 ENCOUNTER — Ambulatory Visit: Payer: Self-pay | Attending: Internal Medicine

## 2019-08-05 DIAGNOSIS — U071 COVID-19: Secondary | ICD-10-CM | POA: Insufficient documentation

## 2019-08-05 DIAGNOSIS — Z20822 Contact with and (suspected) exposure to covid-19: Secondary | ICD-10-CM

## 2019-08-06 LAB — NOVEL CORONAVIRUS, NAA: SARS-CoV-2, NAA: DETECTED — AB

## 2019-08-07 ENCOUNTER — Ambulatory Visit: Payer: Self-pay

## 2019-08-07 NOTE — Telephone Encounter (Signed)
See Lab note. Returned call from patient and he was read his COVID-19 test result of 08/05/19.

## 2019-11-26 ENCOUNTER — Ambulatory Visit: Payer: Self-pay | Attending: Internal Medicine

## 2019-11-26 DIAGNOSIS — Z23 Encounter for immunization: Secondary | ICD-10-CM

## 2019-11-26 NOTE — Progress Notes (Signed)
   [id number]  Name:  Hudsen Fei.    MRN: [id number] DOB: [birth year]  11/26/2019  Mr. Hidrogo was observed post Covid-19 immunization for 15 minutes without incident. He was provided with Vaccine Information Sheet and instruction to access the V-Safe system.   Mr. Delpilar was instructed to call 911 with any severe reactions post vaccine: Marland Kitchen Difficulty breathing  . Swelling of face and throat  . A fast heartbeat  . A bad rash all over body  . Dizziness and weakness   Immunizations Administered    Name Date Dose VIS Date Route   Pfizer COVID-19 Vaccine 11/26/2019  1:37 PM 0.3 mL 09/18/2018 Intramuscular   Manufacturer: [place]   Lot: [id number]   [place]: [address]-9

## 2019-12-24 ENCOUNTER — Ambulatory Visit: Payer: Self-pay

## 2019-12-26 ENCOUNTER — Ambulatory Visit: Payer: Self-pay | Attending: Internal Medicine

## 2019-12-26 DIAGNOSIS — Z23 Encounter for immunization: Secondary | ICD-10-CM

## 2019-12-26 NOTE — Progress Notes (Signed)
   [id number]  Name:  Clifford Peterson.    MRN: [id number] DOB: Jun 20, 1976  12/26/2019  [name] was observed post Covid-19 immunization for 15 minutes without incident. He was provided with Vaccine Information Sheet and instruction to access the V-Safe system.   [name] was instructed to call 911 with any severe reactions post vaccine: Marland Kitchen Difficulty breathing  . Swelling of face and throat  . A fast heartbeat  . A bad rash all over body  . Dizziness and weakness   Immunizations Administered    Name Date Dose VIS Date Route   Pfizer COVID-19 Vaccine 12/26/2019  3:16 PM 0.3 mL 09/18/2018 Intramuscular   Manufacturer: [employer], [employer]   Lot: [id number]   [place]: [address]-3

## 2021-04-04 ENCOUNTER — Encounter (HOSPITAL_COMMUNITY): Payer: Self-pay | Admitting: Emergency Medicine

## 2021-04-04 ENCOUNTER — Other Ambulatory Visit: Payer: Self-pay

## 2021-04-04 ENCOUNTER — Emergency Department (HOSPITAL_COMMUNITY)
Admission: EM | Admit: 2021-04-04 | Discharge: 2021-04-04 | Disposition: A | Discharge status: Home / Self Care | Payer: BC Managed Care – PPO | Attending: Emergency Medicine | Admitting: Emergency Medicine

## 2021-04-04 DIAGNOSIS — H5712 Ocular pain, left eye: Secondary | ICD-10-CM | POA: Diagnosis not present

## 2021-04-04 DIAGNOSIS — R001 Bradycardia, unspecified: Secondary | ICD-10-CM | POA: Insufficient documentation

## 2021-04-04 DIAGNOSIS — F17210 Nicotine dependence, cigarettes, uncomplicated: Secondary | ICD-10-CM | POA: Diagnosis not present

## 2021-04-04 MED ORDER — OXYCODONE-ACETAMINOPHEN 5-325 MG PO TABS: 1.0000 | ORAL_TABLET | Freq: Three times a day (TID) | ORAL | 0 refills | Status: AC | PRN

## 2021-04-04 MED ORDER — FLUORESCEIN SODIUM 1 MG OP STRP
1.0000 | ORAL_STRIP | Freq: Once | OPHTHALMIC | Status: AC
  Administered 2021-04-04: 1 via OPHTHALMIC
  Filled 2021-04-04: qty 1

## 2021-04-04 MED ORDER — TETRACAINE HCL 0.5 % OP SOLN
2.0000 [drp] | Freq: Once | OPHTHALMIC | Status: AC
  Administered 2021-04-04: 2 [drp] via OPHTHALMIC
  Filled 2021-04-04: qty 4

## 2021-04-04 NOTE — ED Provider Notes (Signed)
[facility] EMERGENCY DEPARTMENT Provider Note   CSN: [id number] Arrival date & time: 04/04/21  1806     History Chief Complaint  Patient presents with   Eye Injury    Clifford Peterson. is a 45 y.o. male.  HPI  Patient with no significant medical history presents to the emergency department with chief complaint of trauma to his left eye.  Patient states yesterday he was out picking up sticks and unfortunately a stick hit him in his left eye.  He states he has some pain at that time but it was not severe, but  this morning he developed severe photophobia making it difficult for him to open up his eye.  He states even when he has his right noted open it hurts his left eye.  He denies a gritty-like sensation in his eye, denies change in vision, denies drainage or discharge from his eye, does not wear contacts or corrective lenses.  He has no complaints at this time.  Does not endorse fevers, chills, chest pain, shortness of breath, abdominal pain.  History reviewed. No pertinent past medical history.  There are no problems to display for this patient.   Past Surgical History:  Procedure Laterality Date   HERNIA REPAIR     X 2 ( one 15 years ago)   Left Knee Arthroscopy     x 2   Right elbow surgery     Right knee Arthroscopy     X 2       No family history on file.  Social History   Tobacco Use   Smoking status: Every Day    Packs/day: 0.50    Types: Cigarettes   Smokeless tobacco: Never  Vaping Use   Vaping Use: Never used  Substance Use Topics   Alcohol use: Yes    Comment: once every 2 weeks - Beer   Drug use: No    Home Medications Prior to Admission medications   Medication Sig Start Date End Date Taking? Authorizing Provider  docusate sodium (COLACE) 100 MG capsule Take 1 capsule (100 mg total) by mouth 3 (three) times daily as needed. 05/04/15   Grier Mitts, PA-C  oxyCODONE-acetaminophen (ROXICET) 5-325 MG tablet Take 1-2 tablets by mouth  every 4 (four) hours as needed for severe pain. 05/04/15   Grier Mitts, PA-C    Allergies    Patient has no known allergies.  Review of Systems   Review of Systems  Constitutional:  Negative for chills and fever.  HENT:  Negative for congestion.   Eyes:  Positive for photophobia. Negative for pain, redness and visual disturbance.  Respiratory:  Negative for shortness of breath.   Cardiovascular:  Negative for chest pain.  Gastrointestinal:  Negative for abdominal pain.  Genitourinary:  Negative for enuresis.  Musculoskeletal:  Negative for back pain.  Skin:  Negative for rash.  Neurological:  Negative for dizziness.  Hematological:  Does not bruise/bleed easily.   Physical Exam Updated Vital Signs BP 120/75 (BP Location: Right Arm)   Pulse (!) 57   Temp 97.7 F (36.5 C) (Oral)   Resp 17   Ht 6' 2"  (1.88 m)   Wt 84.8 kg   SpO2 100%   BMI 24.01 kg/m   Physical Exam Vitals and nursing note reviewed.  Constitutional:      General: He is not in acute distress.    Appearance: Normal appearance. He is not ill-appearing or diaphoretic.  HENT:     Head: Normocephalic and  atraumatic.     Nose: No congestion or rhinorrhea.  Eyes:     Extraocular Movements: Extraocular movements intact.     Pupils: Pupils are equal, round, and reactive to light.     Comments: Patient's left eye was visualized he has slight sclera injection, no obvious defects present, EOMs intact, PERRLA, no blood noted in the anterior chamber, no dendritic lesion present, no proptosis noted.  Cardiovascular:     Rate and Rhythm: Regular rhythm. Bradycardia present.  Pulmonary:     Effort: Pulmonary effort is normal.  Musculoskeletal:     Cervical back: Neck supple.     Right lower leg: No edema.     Left lower leg: No edema.  Skin:    General: Skin is warm and dry.  Neurological:     Mental Status: He is alert.  Psychiatric:        Mood and Affect: Mood normal.    ED Results / Procedures /  Treatments   Labs (all labs ordered are listed, but only abnormal results are displayed) Labs Reviewed - No data to display  EKG None  Radiology No results found.  Procedures Procedures   Medications Ordered in ED Medications  tetracaine (PONTOCAINE) 0.5 % ophthalmic solution 2 drop (2 drops Left Eye Given 04/04/21 1938)  fluorescein ophthalmic strip 1 strip (1 strip Both Eyes Given 04/04/21 1958)    ED Course  I have reviewed the triage vital signs and the nursing notes.  Pertinent labs & imaging results that were available during my care of the patient were reviewed by me and considered in my medical decision making (see chart for details).    MDM Rules/Calculators/A&P                          Initial impression-patient presents with left eye injury.  He is alert, does not appear acute stress, vital signs reassuring.  Will obtained visual acuity, intraocular pressure, and fluorescein stain for further evaluation.  Work-up-visual acuity will be deferred as patient is unable to open of his eye due to increased pain from light.  Intraocular pressure of the left eye 19 Intraocular pressure of the right eye 16  Using fluorescein stain and a Woods lamp there is no fluorescein stain uptake, no ulcer or other gross abnormalities present.  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  Consult-spoke with Sharee Pimple from [facility] she does not recommend antibiotic treatment at this time, she would like to see him tomorrow at 8 AM for further evaluation.  Rule out-I have low suspicion for orbital cellulitis or periseptal cellulitis as patient had no pain with EOMs, there is no induration, fluctuance noted on exam around eyes.  Low suspicion for keratitis as patient does not wear contacts, eyes were visualized there is no visible dendritic lesion noted, there is no severe amount of purulent discharge  noted. Low suspicion for hyphema as patient denies recent trauma to the area no blood noted in the anterior chamber.  Low suspicion for global rupture as intraocular pressure within normal limits bilaterally.  Low suspicion for corneal abrasion as there is no uptake in fluorescein stain.   Plan-  Left eye pain-likely patient is having from traumatic iritis we will have him follow-up with [facility] tomorrow for further evaluation.  Vital signs have remained stable, no indication for hospital admission.   Patient given at home care as well strict return precautions.  Patient verbalized that they understood agreed to said plan.  Final Clinical Impression(s) / ED Diagnoses Final diagnoses:  Left eye pain    Rx / DC Orders ED Discharge Orders     None        Aron Baba 04/04/21 2051    Sherwood Gambler, MD 04/05/21 [phone]

## 2021-04-04 NOTE — Discharge Instructions (Addendum)
Suspect you are suffering from traumatic iritis I recommend over-the-counter pain medications as needed as well as decreasing light into your eyes.  You must follow-up with ophthalmology, you have an appointment tomorrow [facility] at 8 AM please go to this appointment.  Come back to the emergency department if you develop chest pain, shortness of breath, severe abdominal pain, uncontrolled nausea, vomiting, diarrhea.

## 2021-04-04 NOTE — ED Triage Notes (Signed)
Pt states he was bending down to pick up something when a stick poked him in his LT eye around 2000 last night. Pt c/o pain in LT eye and light sensitivity to bilateral eyes.

## 2021-04-05 MED FILL — Oxycodone w/ Acetaminophen Tab 5-325 MG: ORAL | Qty: 6 | Status: AC
